# Patient Record
Sex: Male | Born: 1960
Health system: Southern US, Community
[De-identification: ages and names within clinical notes are randomized; demographics above are authoritative.]

---

## 1998-09-12 ENCOUNTER — Encounter: Payer: Self-pay | Admitting: Emergency Medicine

## 1998-09-13 ENCOUNTER — Inpatient Hospital Stay (HOSPITAL_COMMUNITY): Admission: AD | Admit: 1998-09-13 | Discharge: 1998-09-13 | Payer: Self-pay | Admitting: Cardiology

## 1998-09-13 ENCOUNTER — Encounter: Payer: Self-pay | Admitting: Cardiology

## 2000-11-09 ENCOUNTER — Emergency Department (HOSPITAL_COMMUNITY): Admission: EM | Admit: 2000-11-09 | Discharge: 2000-11-09 | Payer: Self-pay | Admitting: Emergency Medicine

## 2012-08-25 ENCOUNTER — Emergency Department (INDEPENDENT_AMBULATORY_CARE_PROVIDER_SITE_OTHER): Payer: Self-pay

## 2012-08-25 ENCOUNTER — Encounter (HOSPITAL_COMMUNITY): Payer: Self-pay | Admitting: Emergency Medicine

## 2012-08-25 ENCOUNTER — Emergency Department (INDEPENDENT_AMBULATORY_CARE_PROVIDER_SITE_OTHER)
Admission: EM | Admit: 2012-08-25 | Discharge: 2012-08-25 | Disposition: A | Discharge status: Home / Self Care | Payer: Self-pay | Source: Home / Self Care | Attending: Emergency Medicine | Admitting: Emergency Medicine

## 2012-08-25 DIAGNOSIS — M7542 Impingement syndrome of left shoulder: Secondary | ICD-10-CM

## 2012-08-25 DIAGNOSIS — M5030 Other cervical disc degeneration, unspecified cervical region: Secondary | ICD-10-CM

## 2012-08-25 MED ORDER — METHOCARBAMOL 500 MG PO TABS: 500.0000 mg | ORAL_TABLET | Freq: Three times a day (TID) | ORAL | Status: DC

## 2012-08-25 MED ORDER — MELOXICAM 15 MG PO TABS: 15.0000 mg | ORAL_TABLET | Freq: Every day | ORAL | Status: DC

## 2012-08-25 MED ORDER — TRAMADOL HCL 50 MG PO TABS: 100.0000 mg | ORAL_TABLET | Freq: Three times a day (TID) | ORAL | Status: DC | PRN

## 2012-08-25 MED ORDER — PREDNISONE 5 MG PO KIT: 1.0000 | PACK | Freq: Every day | ORAL | Status: DC

## 2012-08-25 NOTE — ED Notes (Signed)
Pt  Reports neck  Pain  With  Pain  Down  l  Arm  And  l  Shoulder area      X  1  Month  denys Any  specefic injury          He  Reports  The pain  Is  Worse  On  Movement  And  Or  posistions             He  Reports  The          Pain  Is      Worse  On   Movement  And    Certain     posistions         denys  Any  specfic  Injury            Pt ambulated  To  Room  With a  Slow  Steady  gait

## 2012-08-25 NOTE — ED Provider Notes (Signed)
Chief Complaint  Patient presents with  . Neck Pain    History of Present Illness:   Jay Hoover is a 51 year old male who presents today with a one-month history of pain in the left side of his neck along the trapezius ridge reading on down to his shoulder. The pain radiates to the upper arm but not below the elbow at all. He denies any specific injury. He has been lifting weights a lot. The pain is worse if he moves his neck, at nighttime if he lies on his left side, or if he turns his neck. He tried non-steroidal anti-inflammatories without mitral his. He notes decreased strength in his arm to the shoulder but good handgrip. He denies any numbness or tingling. The shoulder pain is worse with abduction.  Review of Systems:  Other than noted above, the patient denies any of the following symptoms: Constitutional:  No fever, chills, or sweats. ENT:  No nasal congestion, sore throat, or oral ulcerations or lesions. Neck:  No swelling, or adenopathy.  Full ROM without pain. Cardiac:  No chest pain, tightness, or pressure. Respiratory:  No cough, wheezing, or dyspnea. M-S:  No joint pain, muscle pain, or back problems. Neuro:  No muscle weakness, numbness or paresthesias.  PMFSH:  Past medical history, family history, social history, meds, and allergies were reviewed.  Physical Exam:   Vital signs:  BP 130/88  Pulse 88  Temp 98 F (36.7 C) (Oral)  Resp 18  SpO2 97% General:  Alert, oriented and in no distress. Eye:  PERRL, full EOMs. ENT:  Pharynx clear, no oral lesions. Neck:  There was pain to palpation along the left trapezius ridge but not on the right. The neck has a full range of motion but with pain on rotation to either side, on flexion, and extension. Lungs:  No respiratory distress.  Breath sounds clear and equal bilaterally.  No wheezes, rales or rhonchi. Heart:  Regular rhythm.  No gallops, murmers, or rubs. Ext:  No upper extremity edema, pulses full.  Full ROM of joints with no  joint or muscle pain to palpation. Exam of the shoulder reveals pain to palpation both posteriorly above and below the spine of the scapula and anteriorly over the bicipital tendon in the a.c. joint. The shoulder itself has a full range of motion with minimal pain on abduction. Neer sign was positive, Hawkins sign was positive, empty can sign was negative with normal muscle strength. Neuro:  Alert and oriented times 3.  No focal muscle weakness.  DTRs symmetric.  Sensation intact to light touch. Skin: Clear, warm and dry.  No rash.  Good capillary refill.  Radiology:  Dg Cervical Spine Complete  08/25/2012  *RADIOLOGY REPORT*  Clinical Data: Neck pain and left shoulder pain  CERVICAL SPINE - COMPLETE 4+ VIEW  Comparison: None  Findings: Normal alignment.  No fracture or mass.  Disc degeneration and spondylosis at C5-6 with mild to moderate foraminal encroachment bilaterally at C5-6.  Remaining foramen are patent.  IMPRESSION: Disc degeneration and spondylosis C5-6 causing foraminal narrowing bilaterally.   Original Report Authenticated By: Janeece Riggers, M.D.      Dg Shoulder Left  08/25/2012  *RADIOLOGY REPORT*  Clinical Data: Left shoulder pain  LEFT SHOULDER - 2+ VIEW  Comparison: None.  Findings: Negative for fracture or mass.  Glenohumeral joint is normal.  AC degenerative change with spurring.  IMPRESSION: AC degenerative change.  No acute abnormality.   Original Report Authenticated By: Janeece Riggers, M.D.  I reviewed the images independently and personally and concur with the radiologist's findings.  Assessment:  The primary encounter diagnosis was Impingement syndrome of left shoulder. A diagnosis of DDD (degenerative disc disease), cervical was also pertinent to this visit.  I think he has both degenerative disc disease with symptomatic narrowing of the C5-C6 and probable cervical radiculopathy. In addition to this he has shoulder impingement syndrome secondary to the degenerative  changes in the a.c. joint. He will need to followup with specialist for both.  Plan:   1.  The following meds were prescribed:   New Prescriptions   MELOXICAM (MOBIC) 15 MG TABLET    Take 1 tablet (15 mg total) by mouth daily.   METHOCARBAMOL (ROBAXIN) 500 MG TABLET    Take 1 tablet (500 mg total) by mouth 3 (three) times daily.   PREDNISONE 5 MG KIT    Take 1 kit (5 mg total) by mouth daily after breakfast. Prednisone 5 mg 6 day dosepack.  Take as directed.   TRAMADOL (ULTRAM) 50 MG TABLET    Take 2 tablets (100 mg total) by mouth every 8 (eight) hours as needed for pain.   2.  The patient was instructed in symptomatic care and handouts were given. He was given some exercises to do for the shoulder. 3.  The patient was told to return if becoming worse in any way, if no better in 3 or 4 days, and given some red flag symptoms that would indicate earlier return.  Follow up:  The patient was told to follow up with Dr. Jene Every for the shoulder and Dr. Barnett Abu for the neck problem.     Reuben Likes, MD 08/25/12 1145

## 2013-09-24 ENCOUNTER — Emergency Department (HOSPITAL_COMMUNITY)
Admission: EM | Admit: 2013-09-24 | Discharge: 2013-09-24 | Disposition: A | Discharge status: Home / Self Care | Payer: No Typology Code available for payment source | Attending: Emergency Medicine | Admitting: Emergency Medicine

## 2013-09-24 ENCOUNTER — Encounter (HOSPITAL_COMMUNITY): Payer: Self-pay | Admitting: Emergency Medicine

## 2013-09-24 DIAGNOSIS — S8990XA Unspecified injury of unspecified lower leg, initial encounter: Secondary | ICD-10-CM | POA: Insufficient documentation

## 2013-09-24 DIAGNOSIS — IMO0002 Reserved for concepts with insufficient information to code with codable children: Secondary | ICD-10-CM | POA: Insufficient documentation

## 2013-09-24 DIAGNOSIS — Y92410 Unspecified street and highway as the place of occurrence of the external cause: Secondary | ICD-10-CM | POA: Insufficient documentation

## 2013-09-24 DIAGNOSIS — Z79899 Other long term (current) drug therapy: Secondary | ICD-10-CM | POA: Insufficient documentation

## 2013-09-24 DIAGNOSIS — Y9389 Activity, other specified: Secondary | ICD-10-CM | POA: Insufficient documentation

## 2013-09-24 DIAGNOSIS — S4980XA Other specified injuries of shoulder and upper arm, unspecified arm, initial encounter: Secondary | ICD-10-CM | POA: Insufficient documentation

## 2013-09-24 DIAGNOSIS — F17200 Nicotine dependence, unspecified, uncomplicated: Secondary | ICD-10-CM | POA: Insufficient documentation

## 2013-09-24 DIAGNOSIS — R110 Nausea: Secondary | ICD-10-CM | POA: Insufficient documentation

## 2013-09-24 DIAGNOSIS — S0990XA Unspecified injury of head, initial encounter: Secondary | ICD-10-CM | POA: Insufficient documentation

## 2013-09-24 DIAGNOSIS — S0993XA Unspecified injury of face, initial encounter: Secondary | ICD-10-CM | POA: Insufficient documentation

## 2013-09-24 DIAGNOSIS — Z791 Long term (current) use of non-steroidal anti-inflammatories (NSAID): Secondary | ICD-10-CM | POA: Insufficient documentation

## 2013-09-24 DIAGNOSIS — S46909A Unspecified injury of unspecified muscle, fascia and tendon at shoulder and upper arm level, unspecified arm, initial encounter: Secondary | ICD-10-CM | POA: Insufficient documentation

## 2013-09-24 MED ORDER — CYCLOBENZAPRINE HCL 10 MG PO TABS: 10.0000 mg | ORAL_TABLET | Freq: Two times a day (BID) | ORAL | Status: DC | PRN

## 2013-09-24 MED ORDER — NAPROXEN 500 MG PO TABS: 500.0000 mg | ORAL_TABLET | Freq: Two times a day (BID) | ORAL | Status: DC

## 2013-09-24 MED ORDER — HYDROCODONE-ACETAMINOPHEN 5-325 MG PO TABS: 1.0000 | ORAL_TABLET | Freq: Four times a day (QID) | ORAL | Status: DC | PRN
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
## 2013-09-25 NOTE — ED Provider Notes (Signed)
Medical screening examination/treatment/procedure(s) were performed by non-physician practitioner and as supervising physician I was immediately available for consultation/collaboration.  EKG Interpretation   None         William Shantai Tiedeman, MD 09/25/13 0021 

## 2017-02-26 ENCOUNTER — Encounter (HOSPITAL_COMMUNITY): Payer: Self-pay | Admitting: *Deleted

## 2017-02-26 ENCOUNTER — Ambulatory Visit (HOSPITAL_COMMUNITY)
Admission: EM | Admit: 2017-02-26 | Discharge: 2017-02-26 | Disposition: A | Discharge status: Home / Self Care | Payer: Self-pay | Attending: Internal Medicine | Admitting: Internal Medicine

## 2017-02-26 DIAGNOSIS — L723 Sebaceous cyst: Secondary | ICD-10-CM

## 2017-02-26 MED ORDER — DOXYCYCLINE HYCLATE 100 MG PO CAPS: 100.0000 mg | ORAL_CAPSULE | Freq: Two times a day (BID) | ORAL | 0 refills | Status: DC

## 2017-02-26 MED FILL — DOXYCYCLINE HYCLATE 100 MG: 100 | 10 days supply | Qty: 20 | Fill #0

## 2017-02-26 NOTE — ED Triage Notes (Signed)
Pt  Has    sev    Lesions  /  On top  Of      Head  For      sev  Months        He  Reports  It  Is  Not   painfull

## 2017-02-26 NOTE — ED Provider Notes (Signed)
CSN: [id number]     Arrival date & time 02/26/17  1617 History   None    Chief Complaint  Patient presents with  . Abscess   (Consider location/radiation/quality/duration/timing/severity/associated sxs/prior Treatment) Patient has abscess on left scalp   The history is provided by the patient.  Abscess  Location:  Head/neck Head/neck abscess location:  Scalp Size:  3 cm Abscess quality: draining, fluctuance and painful   Red streaking: no   Duration:  2 months Progression:  Worsening Pain details:    Quality:  Aching   Severity:  Mild   Duration:  2 months   Timing:  Constant Chronicity:  New Relieved by:  Nothing Worsened by:  Nothing Ineffective treatments:  None tried   History reviewed. No pertinent past medical history. History reviewed. No pertinent surgical history. History reviewed. No pertinent family history. Social History  Substance Use Topics  . Smoking status: Current Every Day Smoker  . Smokeless tobacco: Not on file  . Alcohol use Yes    Review of Systems  Constitutional: Negative.   HENT: Negative.   Eyes: Negative.   Respiratory: Negative.   Cardiovascular: Negative.   Gastrointestinal: Negative.   Endocrine: Negative.   Genitourinary: Negative.   Musculoskeletal: Negative.   Skin: Positive for wound.  Allergic/Immunologic: Negative.   Neurological: Negative.   Hematological: Negative.   Psychiatric/Behavioral: Negative.     Allergies  Patient has no known allergies.  Home Medications   Prior to Admission medications   Medication Sig Start Date End Date Taking? Authorizing Provider  cyclobenzaprine (FLEXERIL) 10 MG tablet Take 1 tablet (10 mg total) by mouth 2 (two) times daily as needed for muscle spasms. 09/24/13   Etta Quill, NP  doxycycline (VIBRAMYCIN) 100 MG capsule Take 1 capsule (100 mg total) by mouth 2 (two) times daily. 02/26/17   Lysbeth Penner, FNP  HYDROcodone-acetaminophen (NORCO/VICODIN) 5-325 MG per tablet Take  1 tablet by mouth every 6 (six) hours as needed for severe pain. 09/24/13   Etta Quill, NP  meloxicam (MOBIC) 15 MG tablet Take 1 tablet (15 mg total) by mouth daily. 08/25/12   Harden Mo, MD  methocarbamol (ROBAXIN) 500 MG tablet Take 1 tablet (500 mg total) by mouth 3 (three) times daily. 08/25/12   Harden Mo, MD  naproxen (NAPROSYN) 500 MG tablet Take 1 tablet (500 mg total) by mouth 2 (two) times daily. 09/24/13   Etta Quill, NP  PredniSONE 5 MG KIT Take 1 kit (5 mg total) by mouth daily after breakfast. Prednisone 5 mg 6 day dosepack.  Take as directed. 08/25/12   Harden Mo, MD  traMADol (ULTRAM) 50 MG tablet Take 2 tablets (100 mg total) by mouth every 8 (eight) hours as needed for pain. 08/25/12   Harden Mo, MD   Meds Ordered and Administered this Visit  Medications - No data to display  BP 128/82 (BP Location: Right Arm)   Pulse 82   Temp 98.6 F (37 C) (Oral)   Resp 18   SpO2 100%  No data found.   Physical Exam  Constitutional: He appears well-developed and well-nourished.  HENT:  Head: Normocephalic and atraumatic.  Eyes: Conjunctivae and EOM are normal. Pupils are equal, round, and reactive to light.  Neck: Normal range of motion. Neck supple.  Cardiovascular: Normal rate, regular rhythm and normal heart sounds.   Pulmonary/Chest: Effort normal and breath sounds normal.  Skin:  Abscess left scalp  Nursing note and vitals reviewed.  Urgent Care Course     .Marland KitchenIncision and Drainage Date/Time: 02/26/2017 5:57 PM Performed by: Lysbeth Penner Authorized by: Sherlene Shams   Consent:    Consent obtained:  Verbal   Consent given by:  Patient   Risks discussed:  Bleeding, incomplete drainage and pain   Alternatives discussed:  No treatment Location:    Type:  Abscess   Size:  3 cm   Location:  Head   Head location:  Scalp Pre-procedure details:    Skin preparation:  Betadine Anesthesia (see MAR for exact dosages):    Anesthesia  method:  Local infiltration   Local anesthetic:  Lidocaine 2% WITH epi Procedure type:    Complexity:  Simple Procedure details:    Needle aspiration: no     Incision types:  Stab incision   Scalpel blade:  11   Wound management:  Probed and deloculated   Drainage:  Bloody and purulent   Drainage amount:  Moderate   Wound treatment:  Wound left open   Packing materials:  None Post-procedure details:    Patient tolerance of procedure:  Tolerated well, no immediate complications    (including critical care time)  Labs Review Labs Reviewed - No data to display  Imaging Review No results found.   Visual Acuity Review  Right Eye Distance:   Left Eye Distance:   Bilateral Distance:    Right Eye Near:   Left Eye Near:    Bilateral Near:         MDM   1. Sebaceous cyst    Incision and Drainage  Doxycycline 131m one po bid x10 days      OLysbeth Penner FNP 02/26/17 1[place] WLander FNP 02/26/17 1805

## 2017-06-12 ENCOUNTER — Encounter (HOSPITAL_COMMUNITY): Payer: Self-pay | Admitting: *Deleted

## 2017-06-12 ENCOUNTER — Ambulatory Visit (HOSPITAL_COMMUNITY)
Admission: EM | Admit: 2017-06-12 | Discharge: 2017-06-12 | Disposition: A | Discharge status: Home / Self Care | Payer: BLUE CROSS/BLUE SHIELD | Attending: Family Medicine | Admitting: Family Medicine

## 2017-06-12 DIAGNOSIS — J069 Acute upper respiratory infection, unspecified: Secondary | ICD-10-CM

## 2017-06-12 MED ORDER — ALBUTEROL SULFATE HFA 108 (90 BASE) MCG/ACT IN AERS: 2.0000 | INHALATION_SPRAY | RESPIRATORY_TRACT | 0 refills | Status: DC | PRN

## 2017-06-12 MED ORDER — PREDNISONE 50 MG PO TABS
ORAL_TABLET | ORAL | 0 refills | Status: DC
Start: 2017-06-12 — End: 2017-10-07

## 2017-06-12 NOTE — Discharge Instructions (Signed)
Use the albuterol inhaler as needed for cough and wheeze, take the prednisone daily as directed, take with food following medications will help with your head cold sympSudafed PE 10 mg every 4 to 6 hours as needed for congestion Allegra or Zyrtec daily as needed for drainage and runny nose. For stronger antihistamine may take Chlor-Trimeton 2 to 4 mg every 4 to 6 hours, may cause drowsiness. Saline nasal spray used frequently. Drink plenty of fluids and stay well-hydrated.

## 2017-06-12 NOTE — ED Triage Notes (Signed)
C/O runny nose, congestion, cough - worsening over past 2 days.  Now vomiting after coughing fits.

## 2017-06-12 NOTE — ED Provider Notes (Signed)
[facility]    CSN: [id number] Arrival date & time: 06/12/17  1655     History   Chief Complaint Chief Complaint  Patient presents with  . Cough  . Nasal Congestion    HPI Jay Hoover is a 56 y.o. male.   56 year old male with history of smoking presents with a 2 day history of chest congestion, head congestion, runny nose, nails congestion, cough and shortness of breath and coughing spasms that lead to posttussive emesis.      History reviewed. No pertinent past medical history.  There are no active problems to display for this patient.   History reviewed. No pertinent surgical history.     Home Medications    Prior to Admission medications   Medication Sig Start Date End Date Taking? Authorizing Provider  albuterol (PROVENTIL HFA;VENTOLIN HFA) 108 (90 Base) MCG/ACT inhaler Inhale 2 puffs into the lungs every 4 (four) hours as needed for wheezing or shortness of breath. 06/12/17   Hayden Rasmussen, NP  naproxen (NAPROSYN) 500 MG tablet Take 1 tablet (500 mg total) by mouth 2 (two) times daily. 09/24/13   Felicie Morn, NP  predniSONE (DELTASONE) 50 MG tablet 1 tab po daily for 6 days. Take with food. 06/12/17   Hayden Rasmussen, NP  traMADol (ULTRAM) 50 MG tablet Take 2 tablets (100 mg total) by mouth every 8 (eight) hours as needed for pain. 08/25/12   Reuben Likes, MD    Family History No family history on file.  Social History Social History  Substance Use Topics  . Smoking status: Current Every Day Smoker  . Smokeless tobacco: Not on file  . Alcohol use Yes     Comment: on weekends     Allergies   Patient has no known allergies.   Review of Systems Review of Systems  Constitutional: Positive for activity change and fever. Negative for diaphoresis and fatigue.  HENT: Positive for congestion and postnasal drip. Negative for ear pain, facial swelling, rhinorrhea, sore throat and trouble swallowing.   Eyes: Negative for pain, discharge and  redness.  Respiratory: Positive for cough. Negative for chest tightness and shortness of breath.   Cardiovascular: Negative.   Gastrointestinal: Negative.   Musculoskeletal: Negative.  Negative for neck pain and neck stiffness.  Neurological: Negative.   All other systems reviewed and are negative.    Physical Exam Triage Vital Signs ED Triage Vitals [06/12/17 1723]  Enc Vitals Group     BP (!) 131/92     Pulse Rate 78     Resp 16     Temp 97.8 F (36.6 C)     Temp src      SpO2 97 %     Weight      Height      Head Circumference      Peak Flow      Pain Score      Pain Loc      Pain Edu?      Excl. in GC?    No data found.   Updated Vital Signs BP (!) 131/92   Pulse 78   Temp 97.8 F (36.6 C)   Resp 16   SpO2 97%   Visual Acuity Right Eye Distance:   Left Eye Distance:   Bilateral Distance:    Right Eye Near:   Left Eye Near:    Bilateral Near:     Physical Exam  Constitutional: He is oriented to person, place, and time. He appears well-developed  and well-nourished. No distress.  HENT:  Oropharynx with minor erythema, mild cobblestoning and clear PND. No exudate  Eyes: EOM are normal.  Neck: Normal range of motion. Neck supple.  Cardiovascular: Normal rate and regular rhythm.   Pulmonary/Chest: Effort normal. No respiratory distress.  Forced expiration reveals bilateral wheezes and coarseness with mildly prolonged expiratory phase.  Musculoskeletal: Normal range of motion. He exhibits no edema.  Lymphadenopathy:    He has no cervical adenopathy.  Neurological: He is alert and oriented to person, place, and time.  Skin: Skin is warm and dry. No rash noted.  Psychiatric: He has a normal mood and affect.  Nursing note and vitals reviewed.    UC Treatments / Results  Labs (all labs ordered are listed, but only abnormal results are displayed) Labs Reviewed - No data to display  EKG  EKG Interpretation None       Radiology No results  found.  Procedures Procedures (including critical care time)  Medications Ordered in UC Medications - No data to display   Initial Impression / Assessment and Plan / UC Course  I have reviewed the triage vital signs and the nursing notes.  Pertinent labs & imaging results that were available during my care of the patient were reviewed by me and considered in my medical decision making (see chart for details).     Use the albuterol inhaler as needed for cough and wheeze, take the prednisone daily as directed, take with food following medications will help with your head cold sympSudafed PE 10 mg every 4 to 6 hours as needed for congestion Allegra or Zyrtec daily as needed for drainage and runny nose. For stronger antihistamine may take Chlor-Trimeton 2 to 4 mg every 4 to 6 hours, may cause drowsiness. Saline nasal spray used frequently. Drink plenty of fluids and stay well-hydrated.  Final Clinical Impressions(s) / UC Diagnoses   Final diagnoses:  Upper respiratory tract infection, unspecified type    New Prescriptions New Prescriptions   ALBUTEROL (PROVENTIL HFA;VENTOLIN HFA) 108 (90 BASE) MCG/ACT INHALER    Inhale 2 puffs into the lungs every 4 (four) hours as needed for wheezing or shortness of breath.   PREDNISONE (DELTASONE) 50 MG TABLET    1 tab po daily for 6 days. Take with food.     Controlled Substance Prescriptions [place] Controlled Substance Registry consulted? Not Applicable   Hayden RasmussenMabe, Muriah Harsha, NP 06/12/17 Ebony [id number]

## 2017-09-16 ENCOUNTER — Ambulatory Visit (HOSPITAL_COMMUNITY)
Admission: EM | Admit: 2017-09-16 | Discharge: 2017-09-16 | Disposition: A | Discharge status: Home / Self Care | Payer: BC Managed Care – PPO | Attending: Emergency Medicine | Admitting: Emergency Medicine

## 2017-09-16 ENCOUNTER — Encounter (HOSPITAL_COMMUNITY): Payer: Self-pay | Admitting: Emergency Medicine

## 2017-09-16 DIAGNOSIS — S39012A Strain of muscle, fascia and tendon of lower back, initial encounter: Secondary | ICD-10-CM

## 2017-09-16 MED ORDER — CYCLOBENZAPRINE HCL 10 MG PO TABS: 10.0000 mg | ORAL_TABLET | Freq: Every day | ORAL | 0 refills | Status: AC

## 2017-09-16 MED ORDER — CYCLOBENZAPRINE HCL 10 MG PO TABS
10.0000 mg | ORAL_TABLET | Freq: Every day | ORAL | 0 refills | Status: DC
Start: 2017-09-16 — End: 2017-09-16

## 2017-09-16 MED FILL — CYCLOBENZAPRINE 10 MG TAB: 10 | 10 days supply | Qty: 10 | Fill #0

## 2017-09-16 NOTE — ED Provider Notes (Signed)
[facility]    CSN: [id number] Arrival date & time: 09/16/17  1328     History   Chief Complaint Chief Complaint  Patient presents with  . Back Pain    HPI Delene Lollerrance Shroff is a 56 y.o. male presenting with low-mid back pain. Patient reports he shoveled his driveway and multiple neighbor's driveways on Monday. He had mild pain on Tuesday that has continued to worsen, now it is 8/10. Pain radiates to groin but denies saddle anesthesia. No loss of bowel/bladder control. No neck pain. Has not taken anything for relief. Works as Public relations account executivecorrectional officer.   HPI  History reviewed. No pertinent past medical history.  There are no active problems to display for this patient.   History reviewed. No pertinent surgical history.     Home Medications    Prior to Admission medications   Medication Sig Start Date End Date Taking? Authorizing Provider  albuterol (PROVENTIL HFA;VENTOLIN HFA) 108 (90 Base) MCG/ACT inhaler Inhale 2 puffs into the lungs every 4 (four) hours as needed for wheezing or shortness of breath. 06/12/17   Hayden RasmussenMabe, David, NP  naproxen (NAPROSYN) 500 MG tablet Take 1 tablet (500 mg total) by mouth 2 (two) times daily. 09/24/13   Felicie MornSmith, David, NP  predniSONE (DELTASONE) 50 MG tablet 1 tab po daily for 6 days. Take with food. 06/12/17   Hayden RasmussenMabe, David, NP  traMADol (ULTRAM) 50 MG tablet Take 2 tablets (100 mg total) by mouth every 8 (eight) hours as needed for pain. 08/25/12   Reuben LikesKeller, David C, MD    Family History History reviewed. No pertinent family history.  Social History Social History   Tobacco Use  . Smoking status: Current Every Day Smoker  Substance Use Topics  . Alcohol use: Yes    Comment: on weekends  . Drug use: No     Allergies   Patient has no known allergies.   Review of Systems Review of Systems  Constitutional: Negative for chills and fever.  HENT: Negative for ear pain and sore throat.   Eyes: Negative for pain and visual  disturbance.  Respiratory: Negative for cough and shortness of breath.   Cardiovascular: Negative for chest pain and palpitations.  Gastrointestinal: Negative for abdominal pain, nausea and vomiting.  Genitourinary: Negative for difficulty urinating, dysuria and hematuria.  Musculoskeletal: Positive for back pain. Negative for arthralgias and gait problem.  Skin: Negative for color change and rash.  Neurological: Negative for dizziness, seizures, syncope, light-headedness and headaches.  All other systems reviewed and are negative.    Physical Exam Triage Vital Signs ED Triage Vitals [09/16/17 1355]  Enc Vitals Group     BP (!) 175/103     Pulse Rate (!) 110     Resp 18     Temp 98.4 F (36.9 C)     Temp Source Oral     SpO2 95 %     Weight      Height      Head Circumference      Peak Flow      Pain Score 8     Pain Loc      Pain Edu?      Excl. in GC?    No data found.  Updated Vital Signs BP (!) 175/103 (BP Location: Right Arm)   Pulse (!) 110   Temp 98.4 F (36.9 C) (Oral)   Resp 18   SpO2 95%    Physical Exam  Constitutional: He appears well-developed and well-nourished.  HENT:  Head: Normocephalic and atraumatic.  Eyes: Conjunctivae are normal.  Neck: Neck supple.  Cardiovascular: Normal rate and regular rhythm.  No murmur heard. Pulmonary/Chest: Effort normal and breath sounds normal. No respiratory distress.  Abdominal: Soft. There is no tenderness.  Musculoskeletal: He exhibits no edema.  Significant tenderness to palpation of lumbar muscles, right more tender than left. No bony tenderness to cervical spine, thoracic spine or lumbar spine. Negative Straight leg raise.   Gait without abnormalities  Neurological: He is alert.  Skin: Skin is warm and dry.  Psychiatric: He has a normal mood and affect.  Nursing note and vitals reviewed.    UC Treatments / Results  Labs (all labs ordered are listed, but only abnormal results are displayed) Labs  Reviewed - No data to display  EKG  EKG Interpretation None       Radiology No results found.  Procedures Procedures (including critical care time)  Medications Ordered in UC Medications - No data to display   Initial Impression / Assessment and Plan / UC Course  I have reviewed the triage vital signs and the nursing notes.  Pertinent labs & imaging results that were available during my care of the patient were reviewed by me and considered in my medical decision making (see chart for details).     Low back pain from strain vs. Overuse injury. Treated with aleeve during the day, Flexeril at night. Ice/heat. Expect gradual improvement over 1-2 weeks. Discussed return precautions to include increased pain, failure to improve, radiation into legs. Patient verbalized understanding and is agreeable with plan.  Advised to monitor his blood pressure and follow up with PCP if it is remaining elevated.   Final Clinical Impressions(s) / UC Diagnoses   Final diagnoses:  None    ED Discharge Orders    None       Controlled Substance Prescriptions [place] Controlled Substance Registry consulted? Not Applicable   Lew DawesWieters, Khaidyn Staebell C, PA-C 09/16/17 1602    Lew DawesWieters, Ryland Smoots C, [place]PA-C 09/16/17 1813

## 2017-09-16 NOTE — ED Triage Notes (Signed)
Pt here for mid back pain after shoveling snow

## 2017-09-16 NOTE — Discharge Instructions (Signed)
Pain is likely related to a muscular strain from the heavy lifting with the snow. Should gradually resolve over next 1-2 weeks. Please refrain from anything that aggravates back pain.   Please take Aleeve and/or Tylenol for pain. May take flexeril before going to sleep, may take 1/2 pill if causing drowsiness in the morning.  Please return if symptoms not improving, loss of bowel/bladder control, worsening of symptoms, numbness or tingling into legs.

## 2017-10-07 ENCOUNTER — Ambulatory Visit (HOSPITAL_COMMUNITY)
Admission: EM | Admit: 2017-10-07 | Discharge: 2017-10-07 | Disposition: A | Discharge status: Home / Self Care | Payer: BC Managed Care – PPO | Attending: Physician Assistant | Admitting: Physician Assistant

## 2017-10-07 ENCOUNTER — Encounter (HOSPITAL_COMMUNITY): Payer: Self-pay | Admitting: Emergency Medicine

## 2017-10-07 DIAGNOSIS — R112 Nausea with vomiting, unspecified: Secondary | ICD-10-CM

## 2017-10-07 LAB — OCCULT BLOOD, POC DEVICE: Fecal Occult Bld: NEGATIVE

## 2017-10-07 MED ORDER — ONDANSETRON HCL 4 MG PO TABS: 4.0000 mg | ORAL_TABLET | Freq: Four times a day (QID) | ORAL | 0 refills | Status: AC

## 2017-10-07 NOTE — Discharge Instructions (Signed)
Avoid taking NSAIDS (Aleve, Ibuprofen, Aspirin)  Okay to take tylenol 1000 mg every 8 hours for aches and pains.    Avoid alcohol for the next two weeks.

## 2017-10-07 NOTE — ED Triage Notes (Signed)
PT reports he was at work yesterday and starting vomiting around 6:30pm. PT reports he vomited frequently until 1am. PT reports he saw red blood in more than once episode of vomiting. PT reports he is now tired, has body aches, and generally doesn't feel well.

## 2017-10-07 NOTE — ED Provider Notes (Signed)
10/07/2017 7:00 PM   DOB: 03/07/1961 / MRN: [id number]  SUBJECTIVE:  Jay Hoover is a 57 y.o. male presenting for several episodes of blood streaked emesis.  Denies GERD, dysphagia or odynophagia. Had been taking Aleve for back pain but tells me this was about 1 or so months ago. Did drink on New Years but was not an abnormal amount for him. Denies bloody stools and dark tarry stools.  He is not dizziness when he stands up.  He is not taking any medication chronically at this time.   He has No Known Allergies.   He  has no past medical history on file.    He  reports that he has been smoking cigarettes.  He has been smoking about 0.50 packs per day. he has never used smokeless tobacco. He reports that he drinks alcohol. He reports that he does not use drugs. He  has no sexual activity history on file. The patient  has no past surgical history on file.  His family history is not on file.  Review of Systems  Constitutional: Negative for chills and fever.  Gastrointestinal: Positive for nausea and vomiting. Negative for abdominal pain, blood in stool, constipation, diarrhea and melena.  Skin: Negative for itching and rash.    OBJECTIVE:  BP 131/89   Pulse 99   Temp 98.6 F (37 C) (Oral)   Resp 16   Ht 5\' 7"  (1.702 m)   Wt 167 lb (75.8 kg)   SpO2 98%   BMI 26.16 kg/m   Pulse Readings from Last 3 Encounters:  10/07/17 99  09/16/17 (!) 110  06/12/17 78     Physical Exam  Constitutional: He appears well-developed. He is active and cooperative.  Non-toxic appearance.  Cardiovascular: Normal rate.  Pulmonary/Chest: Effort normal. No tachypnea.  Abdominal: Soft. Bowel sounds are normal. He exhibits no distension and no mass. There is no tenderness. There is no rebound and no guarding.  Musculoskeletal: Normal range of motion.  Neurological: He is alert.  Skin: Skin is warm and dry. No rash noted. He is not diaphoretic. No erythema. No pallor.  Psychiatric: His mood appears  anxious.  Vitals reviewed.   Results for orders placed or performed during the hospital encounter of 10/07/17 (from the past 72 hour(s))  Occult blood, poc device     Status: None   Collection Time: 10/07/17  6:58 PM  Result Value Ref Range   Fecal Occult Bld NEGATIVE NEGATIVE    No results found.  ASSESSMENT AND PLAN:  The encounter diagnosis was Non-intractable vomiting with nausea, unspecified vomiting type. Fecal occult blood negative.  No GERD, dysphagia or odynophagia.  He may have ingested a toxin which caused the emesis.  At this point I do not think he has an ulcer or GI bleed.  Will treat his nausea. He will avoid NSAIDS and alcohol for the next two weeks.     The patient is advised to call or return to clinic if he does not see an improvement in symptoms, or to seek the care of the closest emergency department if he worsens with the above plan.   Deliah BostonMichael Clark, MHS, PA-C 10/07/2017 7:00 PM    Ofilia Neaslark, Michael L, PA-C 10/07/17 1906

## 2017-12-25 ENCOUNTER — Encounter (HOSPITAL_COMMUNITY): Payer: Self-pay | Admitting: Emergency Medicine

## 2017-12-25 ENCOUNTER — Other Ambulatory Visit: Payer: Self-pay

## 2017-12-25 ENCOUNTER — Emergency Department (HOSPITAL_COMMUNITY)
Admission: EM | Admit: 2017-12-25 | Discharge: 2017-12-25 | Disposition: A | Discharge status: Home / Self Care | Payer: No Typology Code available for payment source | Attending: Emergency Medicine | Admitting: Emergency Medicine

## 2017-12-25 DIAGNOSIS — Z23 Encounter for immunization: Secondary | ICD-10-CM | POA: Diagnosis not present

## 2017-12-25 DIAGNOSIS — Y92149 Unspecified place in prison as the place of occurrence of the external cause: Secondary | ICD-10-CM | POA: Diagnosis not present

## 2017-12-25 DIAGNOSIS — Y939 Activity, unspecified: Secondary | ICD-10-CM | POA: Diagnosis not present

## 2017-12-25 DIAGNOSIS — F17210 Nicotine dependence, cigarettes, uncomplicated: Secondary | ICD-10-CM | POA: Insufficient documentation

## 2017-12-25 DIAGNOSIS — Y990 Civilian activity done for income or pay: Secondary | ICD-10-CM | POA: Insufficient documentation

## 2017-12-25 DIAGNOSIS — S61230A Puncture wound without foreign body of right index finger without damage to nail, initial encounter: Secondary | ICD-10-CM | POA: Diagnosis not present

## 2017-12-25 DIAGNOSIS — W268XXA Contact with other sharp object(s), not elsewhere classified, initial encounter: Secondary | ICD-10-CM | POA: Insufficient documentation

## 2017-12-25 DIAGNOSIS — T148XXA Other injury of unspecified body region, initial encounter: Secondary | ICD-10-CM

## 2017-12-25 MED ORDER — SULFAMETHOXAZOLE-TRIMETHOPRIM 800-160 MG PO TABS
1.0000 | ORAL_TABLET | Freq: Once | ORAL | Status: AC
  Administered 2017-12-25: 1 via ORAL
  Filled 2017-12-25: qty 1

## 2017-12-25 MED ORDER — TETANUS-DIPHTH-ACELL PERTUSSIS 5-2.5-18.5 LF-MCG/0.5 IM SUSP
0.5000 mL | Freq: Once | INTRAMUSCULAR | Status: AC
  Administered 2017-12-25: 0.5 mL via INTRAMUSCULAR
  Filled 2017-12-25: qty 0.5

## 2017-12-25 MED ORDER — SULFAMETHOXAZOLE-TRIMETHOPRIM 800-160 MG PO TABS: 1.0000 | ORAL_TABLET | Freq: Two times a day (BID) | ORAL | 0 refills | Status: AC

## 2017-12-25 NOTE — ED Triage Notes (Signed)
Patient reports he was doing a pat down and his R middle finger was punctured by a shank.

## 2017-12-25 NOTE — ED Provider Notes (Signed)
[facility] EMERGENCY DEPARTMENT Provider Note   CSN: [id number] Arrival date & time: 12/25/17  2112     History   Chief Complaint Chief Complaint  Patient presents with  . Puncture Wound    HPI Jay Hoover is a 57 y.o. male.  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  He states that he was "padding down" an inmate who had a sharp piece of metal hidden in his pocket.  He reports very minimal bleeding of the puncture site initially.  He has not clean the wound.  He denies pain, swelling, and numbness of his finger.  He is unsure of his last tetanus.  He denies taking blood thinners.   History reviewed. No pertinent past medical history.  There are no active problems to display for this patient.   History reviewed. No pertinent surgical history.      Home Medications    Prior to Admission medications   Medication Sig Start Date End Date Taking? Authorizing Provider  ondansetron (ZOFRAN) 4 MG tablet Take 1 tablet (4 mg total) by mouth every 6 (six) hours. 10/07/17   Ofilia Neas, PA-C    Family History History reviewed. No pertinent family history.  Social History Social History   Tobacco Use  . Smoking status: Current Every Day Smoker    Packs/day: 0.50    Types: Cigarettes  . Smokeless tobacco: Never Used  Substance Use Topics  . Alcohol use: Yes    Comment: on weekends  . Drug use: No     Allergies   Patient has no known allergies.   Review of Systems Review of Systems  Constitutional: Negative for chills and fever.  Musculoskeletal: Positive for arthralgias (Puncture wound right middle finger). Negative for joint swelling.  Skin: Positive for wound. Negative for color change.  Neurological: Negative for weakness and numbness.  All other systems reviewed and are negative.    Physical  Exam Updated Vital Signs BP (!) 152/105   Pulse 90   Temp 98.2 F (36.8 C) (Oral)   Resp 16   Ht  (1.702 m)   Wt 74.4 kg (164 lb)   SpO2 99%   BMI 25.69 kg/m   Physical Exam  Constitutional: He is oriented to person, place, and time. He appears well-developed and well-nourished. No distress.  HENT:  Head: Atraumatic.  Cardiovascular: Normal rate, regular rhythm and intact distal pulses.  No murmur heard. Pulmonary/Chest: Effort normal and breath sounds normal. No respiratory distress.  Musculoskeletal: Normal range of motion. He exhibits no edema or tenderness.       Hands: Very minimal puncture wound to the dorsal aspect of the right middle finger.  No active bleeding.  No edema. distal sensation intact patient has full range of motion of the finger.  Normal finger to thumb opposition  Neurological: He is alert and oriented to person, place, and time. No sensory deficit. He exhibits normal muscle tone. Coordination normal.  Skin: Skin is warm. Capillary refill takes less than 2 seconds.  Nursing note and vitals reviewed.    ED Treatments / Results  Labs (all labs ordered are listed, but only abnormal results are displayed) Labs Reviewed - No data to display  EKG None  Radiology No results found.  Procedures Procedures (including critical care time)  Medications Ordered in ED Medications  Tdap (  BOOSTRIX) injection 0.5 mL (has no administration in time range)  sulfamethoxazole-trimethoprim (BACTRIM DS,SEPTRA DS) 800-160 MG per tablet 1 tablet (has no administration in time range)     Initial Impression / Assessment and Plan / ED Course  I have reviewed the triage vital signs and the nursing notes.  Pertinent labs & imaging results that were available during my care of the patient were reviewed by me and considered in my medical decision making (see chart for details).     Patient with minimal puncture wound of the right middle finger.  No active bleeding.   Neurovascularly intact.  Tdap updated, will start patient on antibiotics.  Wound precautions were discussed.  Patient agrees to treatment plan and close follow-up if needed.  Final Clinical Impressions(s) / ED Diagnoses   Final diagnoses:  Puncture wound    ED Discharge Orders    None       Pauline Aus, PA-C 12/25/17 2305    Vanetta Mulders, MD 12/29/17 [phone]

## 2017-12-25 NOTE — ED Notes (Signed)
Pt finger cleaned and bandage applied

## 2017-12-25 NOTE — Discharge Instructions (Addendum)
Clean the wound with mild soap and water.  You may keep it bandaged if needed.  Follow-up with your doctor or return to the ER for any worsening symptoms such as swelling, redness, drainage, red streaking.

## 2018-02-15 ENCOUNTER — Encounter (HOSPITAL_COMMUNITY): Payer: Self-pay | Admitting: Emergency Medicine

## 2018-02-15 ENCOUNTER — Ambulatory Visit (HOSPITAL_COMMUNITY)
Admission: EM | Admit: 2018-02-15 | Discharge: 2018-02-15 | Disposition: A | Discharge status: Home / Self Care | Payer: BC Managed Care – PPO | Attending: Family Medicine | Admitting: Family Medicine

## 2018-02-15 DIAGNOSIS — L02811 Cutaneous abscess of head [any part, except face]: Secondary | ICD-10-CM | POA: Diagnosis not present

## 2018-02-15 DIAGNOSIS — L0291 Cutaneous abscess, unspecified: Secondary | ICD-10-CM

## 2018-02-15 MED ORDER — LIDOCAINE-EPINEPHRINE (PF) 2 %-1:200000 IJ SOLN
INTRAMUSCULAR | Status: AC
  Filled 2018-02-15: qty 20

## 2018-02-15 NOTE — ED Provider Notes (Signed)
[facility]    CSN: [id number] Arrival date & time: 02/15/18  1753     History   Chief Complaint Chief Complaint  Patient presents with  . Cyst    HPI Jay Hoover is a 57 y.o. male.   Trevaris presents with complaints of recurrent cyst to scalp which is somewhat tender and increasing in size over the past two months. States had it drained approximately 1 year ago. It itches at times. Has not drained. No fevers. Has not tried any treatments for this. Does not currently have a PCP. Without contributing medical history.    ROS per HPI.      History reviewed. No pertinent past medical history.  There are no active problems to display for this patient.   History reviewed. No pertinent surgical history.     Home Medications    Prior to Admission medications   Medication Sig Start Date End Date Taking? Authorizing Provider  ondansetron (ZOFRAN) 4 MG tablet Take 1 tablet (4 mg total) by mouth every 6 (six) hours. 10/07/17   Ofilia Neas, PA-C    Family History History reviewed. No pertinent family history.  Social History Social History   Tobacco Use  . Smoking status: Current Every Day Smoker    Packs/day: 0.50    Types: Cigarettes  . Smokeless tobacco: Never Used  Substance Use Topics  . Alcohol use: Yes    Comment: on weekends  . Drug use: No     Allergies   Patient has no known allergies.   Review of Systems Review of Systems   Physical Exam Triage Vital Signs ED Triage Vitals [02/15/18 1810]  Enc Vitals Group     BP (!) 162/80     Pulse Rate 91     Resp 18     Temp 97.8 F (36.6 C)     Temp Source Oral     SpO2 96 %     Weight      Height      Head Circumference      Peak Flow      Pain Score      Pain Loc      Pain Edu?      Excl. in GC?    No data found.  Updated Vital Signs BP (!) 162/80 (BP Location: Left Arm)   Pulse 91   Temp 97.8 F (36.6 C) (Oral)   Resp 18   SpO2 96%    Physical Exam    Constitutional: He is oriented to person, place, and time. He appears well-developed and well-nourished.  HENT:  Head:    ~1cm raised fluctuant cyst present with two smaller areas similar; mildly tender and with minimal redness  Cardiovascular: Normal rate and regular rhythm.  Pulmonary/Chest: Effort normal and breath sounds normal.  Neurological: He is alert and oriented to person, place, and time.  Skin: Skin is warm and dry.     UC Treatments / Results  Labs (all labs ordered are listed, but only abnormal results are displayed) Labs Reviewed - No data to display  EKG None  Radiology No results found.  Procedures Incision and Drainage Date/Time: 02/15/2018 7:10 PM Performed by: Georgetta Haber, NP Authorized by: Eustace Moore, MD   Consent:    Consent obtained:  Verbal   Consent given by:  Patient   Risks discussed:  Incomplete drainage and pain   Alternatives discussed:  No treatment, observation and referral Location:    Type:  Cyst   Size:  1 cm   Location:  Head   Head location:  Scalp Pre-procedure details:    Skin preparation:  Betadine Anesthesia (see MAR for exact dosages):    Anesthesia method:  Local infiltration   Local anesthetic:  Lidocaine 2% WITH epi Procedure type:    Complexity:  Simple Procedure details:    Needle aspiration: no     Incision types:  Single straight   Scalpel blade:  11   Wound management:  Extensive cleaning   Drainage:  Purulent   Drainage amount:  Moderate   Wound treatment:  Wound left open   Packing materials:  None Post-procedure details:    Patient tolerance of procedure:  Tolerated well, no immediate complications   (including critical care time)  Medications Ordered in UC Medications - No data to display  Initial Impression / Assessment and Plan / UC Course  I have reviewed the triage vital signs and the nursing notes.  Pertinent labs & imaging results that were available during my care of the  patient were reviewed by me and considered in my medical decision making (see chart for details).     Discussed with patient that without removal of cyst then will likely continue to recur, patient states that it is currently irritable enough that he would like it to be drained and decompressed and he will follow up with derm/surgery/plastics in the future for more complete removal of cyst. Without indications for need of antibiotics at this time. Return precautions provided. Patient verbalized understanding and agreeable to plan.     Final Clinical Impressions(s) / UC Diagnoses   Final diagnoses:  Abscess     Discharge Instructions     Keep dressing in place for the next 24 hours.  Then may wash with soap and water daily, cover to keep clean. Would expect small amount of drainage for the next 48-72 hours. Monitor for signs of infection- increased pain, redness, swelling or purulent drainage. Follow up with primary care provider, dermatology and/or general surgery for persistent or recurrent symptoms.     ED Prescriptions    None     Controlled Substance Prescriptions [place] Controlled Substance Registry consulted? Not Applicable   Georgetta Haber, NP 02/15/18 [phone]

## 2018-02-15 NOTE — Discharge Instructions (Signed)
Keep dressing in place for the next 24 hours.  Then may wash with soap and water daily, cover to keep clean. Would expect small amount of drainage for the next 48-72 hours. Monitor for signs of infection- increased pain, redness, swelling or purulent drainage. Follow up with primary care provider, dermatology and/or general surgery for persistent or recurrent symptoms.

## 2018-02-15 NOTE — ED Triage Notes (Signed)
Pt here with painful cyst to scalp

## 2018-04-12 ENCOUNTER — Encounter (HOSPITAL_COMMUNITY): Payer: Self-pay | Admitting: Emergency Medicine

## 2018-04-12 ENCOUNTER — Ambulatory Visit (HOSPITAL_COMMUNITY)
Admission: EM | Admit: 2018-04-12 | Discharge: 2018-04-12 | Disposition: A | Discharge status: Home / Self Care | Payer: BC Managed Care – PPO | Attending: Family Medicine | Admitting: Family Medicine

## 2018-04-12 DIAGNOSIS — L259 Unspecified contact dermatitis, unspecified cause: Secondary | ICD-10-CM

## 2018-04-12 MED ORDER — PREDNISONE 20 MG PO TABS: 40.0000 mg | ORAL_TABLET | Freq: Every day | ORAL | 0 refills | Status: AC

## 2018-04-12 MED ORDER — HYDROCORTISONE 2.5 % EX CREA: TOPICAL_CREAM | Freq: Two times a day (BID) | CUTANEOUS | 0 refills | Status: AC
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
## 2018-04-12 NOTE — ED Triage Notes (Signed)
Pt here for rash to arms and back of neck that is itching

## 2018-04-12 NOTE — Discharge Instructions (Signed)
Please begin prednisone 40 mg daily for the next 5 days You may use hydrocortisone cream in thin amount twice daily in areas of significant itching  Please take a daily allergy pill like Zyrtec, Claritin or Allegra to help reduce some of the itching, may also use Benadryl before bedtime  Please follow-up if symptoms persisting and not improving

## 2018-06-07 ENCOUNTER — Encounter: Payer: Self-pay | Admitting: Emergency Medicine

## 2018-06-07 ENCOUNTER — Emergency Department (INDEPENDENT_AMBULATORY_CARE_PROVIDER_SITE_OTHER)
Admission: EM | Admit: 2018-06-07 | Discharge: 2018-06-07 | Disposition: A | Discharge status: Home / Self Care | Payer: BC Managed Care – PPO | Source: Home / Self Care | Attending: Family Medicine | Admitting: Family Medicine

## 2018-06-07 DIAGNOSIS — B9689 Other specified bacterial agents as the cause of diseases classified elsewhere: Secondary | ICD-10-CM | POA: Diagnosis not present

## 2018-06-07 DIAGNOSIS — J069 Acute upper respiratory infection, unspecified: Secondary | ICD-10-CM

## 2018-06-07 DIAGNOSIS — J019 Acute sinusitis, unspecified: Secondary | ICD-10-CM | POA: Diagnosis not present

## 2018-06-07 MED ORDER — IPRATROPIUM BROMIDE 0.06 % NA SOLN: 2.0000 | Freq: Four times a day (QID) | NASAL | 1 refills | Status: AC

## 2018-06-07 MED ORDER — GUAIFENESIN ER 600 MG PO TB12: 600.0000 mg | ORAL_TABLET | Freq: Two times a day (BID) | ORAL | 0 refills | Status: AC | PRN

## 2018-06-07 MED ORDER — AMOXICILLIN-POT CLAVULANATE 875-125 MG PO TABS: 1.0000 | ORAL_TABLET | Freq: Two times a day (BID) | ORAL | 0 refills | Status: DC

## 2018-06-07 NOTE — ED Triage Notes (Signed)
Pt c/o cough, dizzy and nasal drainage x2 days. No meds.

## 2018-06-07 NOTE — ED Provider Notes (Signed)
[facility]    CSN: [id number] Arrival date & time: 06/07/18  1714     History   Chief Complaint Chief Complaint  Patient presents with  . Cough    HPI Jay Hoover is a 57 y.o. male.   HPI Jay Hoover is a 57 y.o. male presenting to UC with c/o cough, congestion, dizziness, and nasal drainage for about 2 days.  His wife was seen yesterday and dx with a sinus infection. Pt feels drained today despite taking OTC cough/cold medication as well as using the nasal spray prescribed to his wife yesterday. No known fever. Denies n/v/d. No hx of asthma or pneumonia.   History reviewed. No pertinent past medical history.  There are no active problems to display for this patient.   History reviewed. No pertinent surgical history.     Home Medications    Prior to Admission medications   Medication Sig Start Date End Date Taking? Authorizing Provider  amoxicillin-clavulanate (AUGMENTIN) 875-125 MG tablet Take 1 tablet by mouth 2 (two) times daily. One po bid x 7 days 06/07/18   Lurene Shadow, PA-C  guaiFENesin (MUCINEX) 600 MG 12 hr tablet Take 1-2 tablets (600-1,200 mg total) by mouth 2 (two) times daily as needed for cough or to loosen phlegm. 06/07/18   Lurene Shadow, PA-C  hydrocortisone 2.5 % cream Apply topically 2 (two) times daily. 04/12/18   Wieters, Hallie C, PA-C  ipratropium (ATROVENT) 0.06 % nasal spray Place 2 sprays into both nostrils 4 (four) times daily. 06/07/18   Lurene Shadow, PA-C  ondansetron (ZOFRAN) 4 MG tablet Take 1 tablet (4 mg total) by mouth every 6 (six) hours. 10/07/17   Ofilia Neas, PA-C    Family History History reviewed. No pertinent family history.  Social History Social History   Tobacco Use  . Smoking status: Current Every Day Smoker    Packs/day: 0.50    Types: Cigarettes  . Smokeless tobacco: Never Used  Substance Use Topics  . Alcohol use: Yes    Comment: on weekends  . Drug use: No     Allergies   Patient  has no known allergies.   Review of Systems Review of Systems  Constitutional: Positive for fatigue. Negative for chills and fever.  HENT: Positive for congestion, postnasal drip, sinus pressure, sinus pain and sore throat. Negative for ear pain, trouble swallowing and voice change.   Respiratory: Positive for cough. Negative for shortness of breath.   Cardiovascular: Negative for chest pain and palpitations.  Gastrointestinal: Negative for abdominal pain, diarrhea, nausea and vomiting.  Musculoskeletal: Negative for arthralgias, back pain and myalgias.  Skin: Negative for rash.  Neurological: Positive for headaches. Negative for dizziness and light-headedness.     Physical Exam Triage Vital Signs ED Triage Vitals [06/07/18 1759]  Enc Vitals Group     BP (!) 156/96     Pulse Rate 96     Resp      Temp 98.6 F (37 C)     Temp Source Oral     SpO2 99 %     Weight 158 lb (71.7 kg)     Height      Head Circumference      Peak Flow      Pain Score 0     Pain Loc      Pain Edu?      Excl. in GC?    No data found.  Updated Vital Signs BP (!) 156/96 (BP Location:  Right Arm)   Pulse 96   Temp 98.6 F (37 C) (Oral)   Wt 158 lb (71.7 kg)   SpO2 99%   BMI 24.75 kg/m   Visual Acuity Right Eye Distance:   Left Eye Distance:   Bilateral Distance:    Right Eye Near:   Left Eye Near:    Bilateral Near:     Physical Exam  Constitutional: He is oriented to person, place, and time. He appears well-developed and well-nourished. No distress.  Pt sitting on exam bed, appears acutely ill, mildly fatigued but alert and cooperative during exam.   HENT:  Head: Normocephalic and atraumatic.  Right Ear: Tympanic membrane normal.  Left Ear: Tympanic membrane normal.  Nose: Mucosal edema present. Right sinus exhibits maxillary sinus tenderness and frontal sinus tenderness. Left sinus exhibits maxillary sinus tenderness and frontal sinus tenderness.  Mouth/Throat: Uvula is midline,  oropharynx is clear and moist and mucous membranes are normal.  Eyes: EOM are normal.  Neck: Normal range of motion. Neck supple.  Cardiovascular: Normal rate and regular rhythm.  Pulmonary/Chest: Effort normal and breath sounds normal. No stridor. No respiratory distress. He has no wheezes. He has no rales.  Musculoskeletal: Normal range of motion.  Neurological: He is alert and oriented to person, place, and time.  Skin: Skin is warm and dry. He is not diaphoretic.  Psychiatric: He has a normal mood and affect. His behavior is normal.  Nursing note and vitals reviewed.    UC Treatments / Results  Labs (all labs ordered are listed, but only abnormal results are displayed) Labs Reviewed - No data to display  EKG None  Radiology No results found.  Procedures Procedures (including critical care time)  Medications Ordered in UC Medications - No data to display  Initial Impression / Assessment and Plan / UC Course  I have reviewed the triage vital signs and the nursing notes.  Pertinent labs & imaging results that were available during my care of the patient were reviewed by me and considered in my medical decision making (see chart for details).     Sinus tenderness and fatigue noted on exam. Will tx for bacterial sinusitis secondary to URI Home instructions provided.  Final Clinical Impressions(s) / UC Diagnoses   Final diagnoses:  Acute bacterial rhinosinusitis  Upper respiratory tract infection, unspecified type     Discharge Instructions      Please take antibiotics as prescribed and be sure to complete entire course even if you start to feel better to ensure infection does not come back.  You may take 500mg  acetaminophen every 4-6 hours or in combination with ibuprofen 400-600mg  every 6-8 hours as needed for pain, inflammation, and fever.  Be sure to drink at least eight 8oz glasses of water to stay well hydrated and get at least 8 hours of sleep at night,  preferably more while sick.    Please follow up with family medicine in 1 week if not improving.     ED Prescriptions    Medication Sig Dispense Auth. Provider   amoxicillin-clavulanate (AUGMENTIN) 875-125 MG tablet Take 1 tablet by mouth 2 (two) times daily. One po bid x 7 days 14 tablet Laney Bagshaw O, PA-C   ipratropium (ATROVENT) 0.06 % nasal spray Place 2 sprays into both nostrils 4 (four) times daily. 15 mL Doroteo Glassman, Aracelis Ulrey O, PA-C   guaiFENesin (MUCINEX) 600 MG 12 hr tablet Take 1-2 tablets (600-1,200 mg total) by mouth 2 (two) times daily as needed for cough  or to loosen phlegm. 20 tablet Lurene Shadow, PA-C     Controlled Substance Prescriptions [place] Controlled Substance Registry consulted? Not Applicable   Rolla Plate 06/10/18 7579

## 2018-06-07 NOTE — Discharge Instructions (Signed)
°  Please take antibiotics as prescribed and be sure to complete entire course even if you start to feel better to ensure infection does not come back.  You may take 500mg  acetaminophen every 4-6 hours or in combination with ibuprofen 400-600mg  every 6-8 hours as needed for pain, inflammation, and fever.  Be sure to drink at least eight 8oz glasses of water to stay well hydrated and get at least 8 hours of sleep at night, preferably more while sick.    Please follow up with family medicine in 1 week if not improving.

## 2018-08-30 ENCOUNTER — Encounter (HOSPITAL_COMMUNITY): Payer: Self-pay

## 2018-08-30 ENCOUNTER — Ambulatory Visit (HOSPITAL_COMMUNITY)
Admission: EM | Admit: 2018-08-30 | Discharge: 2018-08-30 | Disposition: A | Discharge status: Home / Self Care | Payer: BC Managed Care – PPO | Attending: Family Medicine | Admitting: Family Medicine

## 2018-08-30 DIAGNOSIS — K4090 Unilateral inguinal hernia, without obstruction or gangrene, not specified as recurrent: Secondary | ICD-10-CM | POA: Diagnosis not present

## 2018-08-30 NOTE — ED Triage Notes (Signed)
Pt presents with abdominal pain he believes to be a hernia.

## 2018-08-30 NOTE — ED Provider Notes (Signed)
[facility]    CSN: [id number] Arrival date & time: 08/30/18  1914     History   Chief Complaint Chief Complaint  Patient presents with  . Abdominal Pain    HPI Jay Hoover is a 57 y.o. male.   He is presenting with groin pain.  He is having a fullness in the right inguinal region.  He has a history of hernia that he has not had repaired.  This area is becoming more full.  He is also feeling of fullness in his scrotum.  He denies any trauma to the abdomen.  No prior abdominal surgeries.  He is having normal bowel movements and normal urination.  HPI  History reviewed. No pertinent past medical history.  There are no active problems to display for this patient.   History reviewed. No pertinent surgical history.     Home Medications    Prior to Admission medications   Medication Sig Start Date End Date Taking? Authorizing Provider  amoxicillin-clavulanate (AUGMENTIN) 875-125 MG tablet Take 1 tablet by mouth 2 (two) times daily. One po bid x 7 days 06/07/18   Jay Hoover  guaiFENesin (MUCINEX) 600 MG 12 hr tablet Take 1-2 tablets (600-1,200 mg total) by mouth 2 (two) times daily as needed for cough or to loosen phlegm. 06/07/18   Jay Hoover  hydrocortisone 2.5 % cream Apply topically 2 (two) times daily. 04/12/18   Hoover, Jay Hoover, Hoover  ipratropium (ATROVENT) 0.06 % nasal spray Place 2 sprays into both nostrils 4 (four) times daily. 06/07/18   Jay Hoover  ondansetron (ZOFRAN) 4 MG tablet Take 1 tablet (4 mg total) by mouth every 6 (six) hours. 10/07/17   Jay Hoover    Family History History reviewed. No pertinent family history.  Social History Social History   Tobacco Use  . Smoking status: Current Every Day Smoker    Packs/day: 0.50    Types: Cigarettes  . Smokeless tobacco: Never Used  Substance Use Topics  . Alcohol use: Yes    Comment: on weekends  . Drug use: No     Allergies   Patient has no known  allergies.   Review of Systems Review of Systems  Constitutional: Negative for fever.  HENT: Negative for congestion.   Respiratory: Negative for cough.   Cardiovascular: Negative for chest pain.  Gastrointestinal: Positive for abdominal pain.  Musculoskeletal: Negative for back pain.  Skin: Negative for color change.  Neurological: Negative for weakness.  Hematological: Negative for adenopathy.  Psychiatric/Behavioral: Negative for agitation.     Physical Exam Triage Vital Signs ED Triage Vitals  Enc Vitals Group     BP      Pulse      Resp      Temp      Temp src      SpO2      Weight      Height      Head Circumference      Peak Flow      Pain Score      Pain Loc      Pain Edu?      Excl. in GC?    No data found.  Updated Vital Signs BP (!) 161/112 (BP Location: Left Arm)   Pulse 93   Temp 97.9 F (36.6 Hoover) (Oral)   Resp 20   SpO2 96%   Visual Acuity Right Eye Distance:   Left Eye Distance:   Bilateral  Distance:    Right Eye Near:   Left Eye Near:    Bilateral Near:     Physical Exam Gen: NAD, alert, cooperative with exam, well-appearing ENT: normal lips, normal nasal mucosa,  Eye: normal EOM, normal conjunctiva and lids CV:  no edema, +2 pedal pulses   Resp: no accessory muscle use, non-labored,  GI: Has a reducible direct hernia on the right side, no tenderness to palpation in the other quadrants.  Soft abdomen, positive bowel sounds Skin: no rashes, no areas of induration  Neuro: normal tone, normal sensation to touch Psych:  normal insight, alert and oriented MSK: normal gait, normal strength   UC Treatments / Results  Labs (all labs ordered are listed, but only abnormal results are displayed) Labs Reviewed - No data to display  EKG None  Radiology No results found.  Procedures Procedures (including critical care time)  Medications Ordered in UC Medications - No data to display  Initial Impression / Assessment and Plan / UC  Course  I have reviewed the triage vital signs and the nursing notes.  Pertinent labs & imaging results that were available during my care of the patient were reviewed by me and considered in my medical decision making (see chart for details).     [name] is a 57 year old male is presenting with a direct inguinal hernia that is reducible.  He has had this previously and has not had surgery yet.  He feels pain in this area but reports having normal bowel movements.  His abdomen is soft.  Denies any trauma to the area.  Provide him with instructions to follow-up with surgery.  Given him indications to seek immediate care. Final Clinical Impressions(s) / UC Diagnoses   Final diagnoses:  Unilateral inguinal hernia without obstruction or gangrene, recurrence not specified     Discharge Instructions     Please call and make an appointment with the surgeon.  Please seek immediate care if that area becomes hard or extremely painful. Please be seen in the emergency room if you aren't able to have a bowl movement.       ED Prescriptions    None     Controlled Substance Prescriptions [place] Controlled Substance Registry consulted? Not Applicable   Jay RudeSchmitz, Natoria Archibald E, MD 08/30/18 2055

## 2018-08-30 NOTE — Discharge Instructions (Signed)
Please call and make an appointment with the surgeon.  Please seek immediate care if that area becomes hard or extremely painful. Please be seen in the emergency room if you aren't able to have a bowl movement.

## 2020-01-08 ENCOUNTER — Emergency Department (INDEPENDENT_AMBULATORY_CARE_PROVIDER_SITE_OTHER): Payer: BC Managed Care – PPO

## 2020-01-08 ENCOUNTER — Emergency Department (INDEPENDENT_AMBULATORY_CARE_PROVIDER_SITE_OTHER)
Admission: EM | Admit: 2020-01-08 | Discharge: 2020-01-08 | Disposition: A | Discharge status: Home / Self Care | Payer: Self-pay | Source: Home / Self Care | Attending: Family Medicine | Admitting: Family Medicine

## 2020-01-08 ENCOUNTER — Emergency Department: Payer: BC Managed Care – PPO

## 2020-01-08 ENCOUNTER — Other Ambulatory Visit: Payer: Self-pay

## 2020-01-08 DIAGNOSIS — M546 Pain in thoracic spine: Secondary | ICD-10-CM

## 2020-01-08 DIAGNOSIS — S43402A Unspecified sprain of left shoulder joint, initial encounter: Secondary | ICD-10-CM

## 2020-01-08 DIAGNOSIS — S161XXA Strain of muscle, fascia and tendon at neck level, initial encounter: Secondary | ICD-10-CM

## 2020-01-08 DIAGNOSIS — S39012A Strain of muscle, fascia and tendon of lower back, initial encounter: Secondary | ICD-10-CM

## 2020-01-08 MED ORDER — PREDNISONE 20 MG PO TABS: ORAL_TABLET | ORAL | 0 refills | Status: DC

## 2020-01-08 NOTE — Discharge Instructions (Addendum)
Apply ice pack for 20 to 30 minutes, 3 to 4 times daily  Continue until pain and swelling decrease.  May take Tylenol for pain as needed.  Wear left shoulder sling until improved. Begin range of motion and stretching exercises as tolerated.
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
## 2022-11-19 ENCOUNTER — Ambulatory Visit
Admission: EM | Admit: 2022-11-19 | Discharge: 2022-11-19 | Disposition: A | Discharge status: Home / Self Care | Payer: BC Managed Care – PPO | Attending: Urgent Care | Admitting: Urgent Care

## 2022-11-19 DIAGNOSIS — Z20822 Contact with and (suspected) exposure to covid-19: Secondary | ICD-10-CM

## 2022-11-19 DIAGNOSIS — R0981 Nasal congestion: Secondary | ICD-10-CM

## 2022-11-19 LAB — POC SARS CORONAVIRUS 2 AG -  ED: SARS Coronavirus 2 Ag: NEGATIVE

## 2022-11-19 NOTE — ED Provider Notes (Signed)
[name]    CSN: [id number] Arrival date & time: 11/19/22  1821      History   Chief Complaint Chief Complaint  Patient presents with   Cough   Nasal Congestion    HPI Jay Hoover is a 62 y.o. male.   Pleasant 62 year old male presents today due to concerns of cough and nasal congestion.  1 week ago, patient started with cough, rhinorrhea, nasal congestion, sore throat, headache.  He works at the [employer] and just found out that roughly 12 other coworkers recently tested positive for [place].  Patient did not take a home COVID test at symptom onset.  He did take a test yesterday however which was negative.  Patient's wife just tested positive for COVID this morning.  Patient is required to get a COVID test prior to returning to work.  Patient states overall he is feeling much improvement since last week.  He has been taking over-the-counter DayQuil NyQuil and Mucinex with improvement to his symptoms.   Cough   History reviewed. No pertinent past medical history.  There are no problems to display for this patient.   History reviewed. No pertinent surgical history.     Home Medications    Prior to Admission medications   Medication Sig Start Date End Date Taking? Authorizing Provider  guaiFENesin (MUCINEX) 600 MG 12 hr tablet Take 1-2 tablets (600-1,200 mg total) by mouth 2 (two) times daily as needed for cough or to loosen phlegm. 06/07/18   Noe Gens, PA-C  hydrocortisone 2.5 % cream Apply topically 2 (two) times daily. 04/12/18   Wieters, Hallie C, PA-C  ipratropium (ATROVENT) 0.06 % nasal spray Place 2 sprays into both nostrils 4 (four) times daily. 06/07/18   Noe Gens, PA-C  ondansetron (ZOFRAN) 4 MG tablet Take 1 tablet (4 mg total) by mouth every 6 (six) hours. 10/07/17   Tereasa Coop, PA-C    Family History History reviewed. No pertinent family history.  Social History Social History   Tobacco Use   Smoking status: Every Day     Packs/day: 0.50    Types: Cigarettes   Smokeless tobacco: Never  Vaping Use   Vaping Use: Never used  Substance Use Topics   Alcohol use: Yes    Comment: on weekends   Drug use: No     Allergies   Patient has no known allergies.   Review of Systems Review of Systems  Respiratory:  Positive for cough.   As per HPI   Physical Exam Triage Vital Signs ED Triage Vitals [11/19/22 1839]  Enc Vitals Group     BP (!) 166/108     Pulse Rate 87     Resp 17     Temp 98.3 F (36.8 C)     Temp Source Oral     SpO2 98 %     Weight      Height      Head Circumference      Peak Flow      Pain Score 0     Pain Loc      Pain Edu?      Excl. in [place]?    No data found.  Updated Vital Signs BP (!) 166/108 (BP Location: Right Arm)   Pulse 87   Temp 98.3 F (36.8 C) (Oral)   Resp 17   SpO2 98%   Visual Acuity Right Eye Distance:   Left Eye Distance:   Bilateral Distance:  Right Eye Near:   Left Eye Near:    Bilateral Near:     Physical Exam Vitals and nursing note reviewed.  Constitutional:      General: He is not in acute distress.    Appearance: Normal appearance. He is normal weight. He is not ill-appearing, toxic-appearing or diaphoretic.  HENT:     Head: Normocephalic and atraumatic.     Right Ear: Tympanic membrane and ear canal normal. No drainage, swelling or tenderness. No middle ear effusion. Tympanic membrane is not erythematous.     Left Ear: Tympanic membrane and ear canal normal. No drainage, swelling or tenderness.  No middle ear effusion. Tympanic membrane is not erythematous.     Nose: Rhinorrhea present. No congestion.     Mouth/Throat:     Mouth: Mucous membranes are moist. No oral lesions.     Pharynx: Oropharynx is clear. No pharyngeal swelling, oropharyngeal exudate, posterior oropharyngeal erythema or uvula swelling.     Tonsils: No tonsillar exudate or tonsillar abscesses.  Eyes:     Extraocular Movements:     Left eye: Normal extraocular  motion.     Conjunctiva/sclera: Conjunctivae normal.     Pupils: Pupils are equal, round, and reactive to light.  Neck:     Thyroid: No thyromegaly.  Cardiovascular:     Rate and Rhythm: Normal rate and regular rhythm.     Pulses: Normal pulses.     Heart sounds: Normal heart sounds. No murmur heard.    No friction rub. No gallop.  Pulmonary:     Effort: Pulmonary effort is normal. No respiratory distress.     Breath sounds: Normal breath sounds. No stridor. No wheezing, rhonchi or rales.  Chest:     Chest wall: No tenderness.  Abdominal:     Palpations: Abdomen is soft.  Musculoskeletal:        General: No tenderness.     Cervical back: Normal range of motion and neck supple. No rigidity.  Lymphadenopathy:     Cervical: No cervical adenopathy.  Skin:    General: Skin is warm.     Capillary Refill: Capillary refill takes less than 2 seconds.     Coloration: Skin is not pale.     Findings: No erythema or rash.  Neurological:     General: No focal deficit present.     Mental Status: He is alert.  Psychiatric:        Mood and Affect: Mood normal.        Behavior: Behavior normal.      UC Treatments / Results  Labs (all labs ordered are listed, but only abnormal results are displayed) Labs Reviewed  POC SARS CORONAVIRUS 2 AG -  ED    EKG   Radiology No results found.  Procedures Procedures (including critical care time)  Medications Ordered in UC Medications - No data to display  Initial Impression / Assessment and Plan / UC Course  I have reviewed the triage vital signs and the nursing notes.  Pertinent labs & imaging results that were available during my care of the patient were reviewed by me and considered in my medical decision making (see chart for details).     Exposure to covid - pt with covid-like sx >1 week ago. Pt had exposures at work, wife now with dx covid. Pt likely HAD covid, and is now resolved. Pt cleared to return to work. Nasal  congestion - OTC medications reviewed, may pick up saline or flonase.  Final Clinical Impressions(s) / UC Diagnoses   Final diagnoses:  Exposure to COVID-19 virus  Nasal congestion     Discharge Instructions      Your covid test is negative. Given your symptoms last week, I suspect you did have covid, but have now successfully gotten over the virus and thus do not require additional treatment. You may continue taking your OTC medications, however please look at the ingredients list and stop taking anything that contains sudafed or phenylephrine. This may be the cause of your elevated blood pressure. Please monitor your blood pressure at home and if it remains elevated >130/80, follow up with your PCP.     ED Prescriptions   None    PDMP not reviewed this encounter.   Chaney Malling, [place] 11/21/22 1241

## 2022-11-19 NOTE — ED Triage Notes (Signed)
Pt c/o cough and runny nose since last Tuesday. Taking mucinex prn. Wife tested pos for COVID at home yesterday.

## 2022-11-19 NOTE — Discharge Instructions (Addendum)
Your covid test is negative. Given your symptoms last week, I suspect you did have covid, but have now successfully gotten over the virus and thus do not require additional treatment. You may continue taking your OTC medications, however please look at the ingredients list and stop taking anything that contains sudafed or phenylephrine. This may be the cause of your elevated blood pressure. Please monitor your blood pressure at home and if it remains elevated >130/80, follow up with your PCP.
